# Patient Record
Sex: Female | Born: 1975 | Race: White | Hispanic: No | Marital: Married | Smoking: Never smoker
Health system: Southern US, Community
[De-identification: ages and names within clinical notes are randomized; demographics above are authoritative.]

## PROBLEM LIST (undated history)

## (undated) ENCOUNTER — Inpatient Hospital Stay (HOSPITAL_COMMUNITY): Payer: Self-pay

## (undated) HISTORY — PX: NO PAST SURGERIES: SHX2092

---

## 1997-10-08 ENCOUNTER — Other Ambulatory Visit: Admission: RE | Admit: 1997-10-08 | Discharge: 1997-10-08 | Payer: Self-pay | Admitting: Obstetrics and Gynecology

## 1998-10-14 ENCOUNTER — Other Ambulatory Visit: Admission: RE | Admit: 1998-10-14 | Discharge: 1998-10-14 | Payer: Self-pay | Admitting: Obstetrics and Gynecology

## 1999-10-22 ENCOUNTER — Other Ambulatory Visit: Admission: RE | Admit: 1999-10-22 | Discharge: 1999-10-22 | Payer: Self-pay | Admitting: Obstetrics and Gynecology

## 2000-11-07 ENCOUNTER — Other Ambulatory Visit: Admission: RE | Admit: 2000-11-07 | Discharge: 2000-11-07 | Payer: Self-pay | Admitting: Obstetrics and Gynecology

## 2001-06-29 ENCOUNTER — Ambulatory Visit (HOSPITAL_COMMUNITY): Admission: RE | Admit: 2001-06-29 | Discharge: 2001-06-29 | Payer: Self-pay | Admitting: Obstetrics and Gynecology

## 2001-06-29 ENCOUNTER — Encounter: Payer: Self-pay | Admitting: Obstetrics and Gynecology

## 2001-08-30 ENCOUNTER — Ambulatory Visit (HOSPITAL_COMMUNITY): Admission: RE | Admit: 2001-08-30 | Discharge: 2001-08-30 | Payer: Self-pay | Admitting: Obstetrics and Gynecology

## 2001-11-11 ENCOUNTER — Inpatient Hospital Stay (HOSPITAL_COMMUNITY): Admission: AD | Admit: 2001-11-11 | Discharge: 2001-11-13 | Payer: Self-pay | Admitting: Obstetrics and Gynecology

## 2001-11-27 ENCOUNTER — Encounter: Admission: RE | Admit: 2001-11-27 | Discharge: 2001-12-27 | Payer: Self-pay | Admitting: Obstetrics and Gynecology

## 2001-12-12 ENCOUNTER — Other Ambulatory Visit: Admission: RE | Admit: 2001-12-12 | Discharge: 2001-12-12 | Payer: Self-pay | Admitting: Obstetrics and Gynecology

## 2003-01-23 ENCOUNTER — Other Ambulatory Visit: Admission: RE | Admit: 2003-01-23 | Discharge: 2003-01-23 | Payer: Self-pay | Admitting: Obstetrics and Gynecology

## 2004-02-12 ENCOUNTER — Other Ambulatory Visit: Admission: RE | Admit: 2004-02-12 | Discharge: 2004-02-12 | Payer: Self-pay | Admitting: Obstetrics and Gynecology

## 2005-02-16 ENCOUNTER — Other Ambulatory Visit: Admission: RE | Admit: 2005-02-16 | Discharge: 2005-02-16 | Payer: Self-pay | Admitting: Obstetrics and Gynecology

## 2005-06-29 ENCOUNTER — Ambulatory Visit (HOSPITAL_COMMUNITY): Admission: RE | Admit: 2005-06-29 | Discharge: 2005-06-29 | Payer: Self-pay | Admitting: Obstetrics and Gynecology

## 2005-08-02 ENCOUNTER — Inpatient Hospital Stay (HOSPITAL_COMMUNITY): Admission: AD | Admit: 2005-08-02 | Discharge: 2005-08-02 | Payer: Self-pay | Admitting: Obstetrics and Gynecology

## 2005-11-09 ENCOUNTER — Ambulatory Visit (HOSPITAL_COMMUNITY): Admission: RE | Admit: 2005-11-09 | Discharge: 2005-11-09 | Payer: Self-pay | Admitting: Obstetrics & Gynecology

## 2006-02-05 ENCOUNTER — Inpatient Hospital Stay (HOSPITAL_COMMUNITY): Admission: AD | Admit: 2006-02-05 | Discharge: 2006-02-07 | Payer: Self-pay | Admitting: Obstetrics and Gynecology

## 2008-05-15 HISTORY — PX: AUGMENTATION MAMMAPLASTY: SUR837

## 2015-03-14 ENCOUNTER — Encounter (HOSPITAL_COMMUNITY): Payer: Self-pay | Admitting: *Deleted

## 2015-03-14 ENCOUNTER — Inpatient Hospital Stay (HOSPITAL_COMMUNITY)
Admission: AD | Admit: 2015-03-14 | Discharge: 2015-03-14 | Disposition: A | Discharge status: Home / Self Care | Payer: BC Managed Care – PPO | Source: Ambulatory Visit | Attending: Obstetrics and Gynecology | Admitting: Obstetrics and Gynecology

## 2015-03-14 DIAGNOSIS — N930 Postcoital and contact bleeding: Secondary | ICD-10-CM

## 2015-03-14 DIAGNOSIS — O4692 Antepartum hemorrhage, unspecified, second trimester: Secondary | ICD-10-CM

## 2015-03-14 DIAGNOSIS — Z3A13 13 weeks gestation of pregnancy: Secondary | ICD-10-CM | POA: Diagnosis not present

## 2015-03-14 DIAGNOSIS — O209 Hemorrhage in early pregnancy, unspecified: Secondary | ICD-10-CM | POA: Diagnosis present

## 2015-03-14 DIAGNOSIS — O26891 Other specified pregnancy related conditions, first trimester: Secondary | ICD-10-CM | POA: Insufficient documentation

## 2015-03-14 DIAGNOSIS — Z6791 Unspecified blood type, Rh negative: Secondary | ICD-10-CM | POA: Diagnosis not present

## 2015-03-14 DIAGNOSIS — O360121 Maternal care for anti-D [Rh] antibodies, second trimester, fetus 1: Secondary | ICD-10-CM

## 2015-03-14 LAB — URINALYSIS, ROUTINE W REFLEX MICROSCOPIC
Bilirubin Urine: NEGATIVE
Glucose, UA: NEGATIVE mg/dL
Ketones, ur: NEGATIVE mg/dL
Leukocytes, UA: NEGATIVE
Nitrite: NEGATIVE
Protein, ur: NEGATIVE mg/dL
Specific Gravity, Urine: 1.01 (ref 1.005–1.030)
Urobilinogen, UA: 0.2 mg/dL (ref 0.0–1.0)
pH: 6 (ref 5.0–8.0)

## 2015-03-14 LAB — WET PREP, GENITAL
CLUE CELLS WET PREP: NONE SEEN
TRICH WET PREP: NONE SEEN
YEAST WET PREP: NONE SEEN

## 2015-03-14 LAB — URINE MICROSCOPIC-ADD ON

## 2015-03-14 MED ORDER — RHO D IMMUNE GLOBULIN 1500 UNIT/2ML IJ SOSY
300.0000 ug | PREFILLED_SYRINGE | Freq: Once | INTRAMUSCULAR | Status: AC
  Administered 2015-03-14: 300 ug via INTRAMUSCULAR
  Filled 2015-03-14: qty 2

## 2015-03-14 NOTE — MAU Provider Note (Signed)
Chief Complaint: Vaginal Bleeding   First Provider Initiated Contact with Patient 03/14/15 1030      SUBJECTIVE HPI: Carol Vaughan is a 39 y.o. G4P3003 at [redacted]w[redacted]d by LMP who presents to maternity admissions reporting onset of bright red bleeding early this morning.  She reports she went to the bathroom during the night and noticed light red bleeding, then the next time she went to the bathroom saw a small clot in the toilet and more bleeding when wiping. She was bleeding red to brownish by the time she left home and upon arriving in MAU reports her pad has very little blood on it.  She reports intercourse last night.   She denies abdominal pain, vaginal itching/burning, urinary symptoms, h/a, dizziness, n/v, or fever/chills.     Vaginal Bleeding The patient's primary symptoms include vaginal bleeding. The patient's pertinent negatives include no pelvic pain or vaginal discharge. This is a new problem. The current episode started today. The problem occurs intermittently. The problem has been gradually improving. The patient is experiencing no pain. She is pregnant. Pertinent negatives include no abdominal pain, chills, constipation, diarrhea, dysuria, fever, flank pain, frequency, headaches, nausea, urgency or vomiting. The vaginal bleeding is lighter than menses. She has been passing clots. She has not been passing tissue. The symptoms are aggravated by intercourse. She has tried nothing for the symptoms. She is sexually active. No, her partner does not have an STD.    History reviewed. No pertinent past medical history. Past Surgical History  Procedure Laterality Date  . No past surgeries     Social History   Social History  . Marital Status: Married    Spouse Name: N/A  . Number of Children: N/A  . Years of Education: N/A   Occupational History  . Not on file.   Social History Main Topics  . Smoking status: Never Smoker   . Smokeless tobacco: Not on file  . Alcohol Use: No  . Drug  Use: No  . Sexual Activity: Not on file   Other Topics Concern  . Not on file   Social History Narrative  . No narrative on file   No current facility-administered medications on file prior to encounter.   No current outpatient prescriptions on file prior to encounter.   No Known Allergies  ROS:  Review of Systems  Constitutional: Negative for fever, chills and fatigue.  HENT: Negative for sinus pressure.   Eyes: Negative for photophobia.  Respiratory: Negative for shortness of breath.   Cardiovascular: Negative for chest pain.  Gastrointestinal: Negative for nausea, vomiting, abdominal pain, diarrhea and constipation.  Genitourinary: Positive for vaginal bleeding. Negative for dysuria, urgency, frequency, flank pain, vaginal discharge, difficulty urinating, vaginal pain and pelvic pain.  Musculoskeletal: Negative for neck pain.  Neurological: Negative for dizziness, weakness and headaches.  Psychiatric/Behavioral: Negative.      I have reviewed patient's Past Medical Hx, Surgical Hx, Family Hx, Social Hx, medications and allergies.   Physical Exam   Patient Vitals for the past 24 hrs:  BP Temp Temp src Pulse Resp SpO2 Height Weight  03/14/15 1309 108/66 mmHg - - 73 16 - - -  03/14/15 0927 125/73 mmHg 98.1 F (36.7 C) Oral 105 18 99 %  (1.651 m) 57.607 kg (127 lb)   Constitutional: Well-developed, well-nourished female in no acute distress.  Cardiovascular: normal rate Respiratory: normal effort GI: Abd soft, non-tender. Pos BS x 4 MS: Extremities nontender, no edema, normal ROM Neurologic: Alert and oriented x  4.  GU: Neg CVAT.  PELVIC EXAM: Cervix pink, visually closed, with small area of erythema, slightly friable to cotton swab, small amount light red bleeding, vaginal walls and external genitalia normal  FHT 160 by doppler  LAB RESULTS Results for orders placed or performed during the hospital encounter of 03/14/15 (from the past 24 hour(s))   Urinalysis, Routine w reflex microscopic (not at [facility])     Status: Abnormal   Collection Time: 03/14/15  9:15 AM  Result Value Ref Range   Color, Urine YELLOW YELLOW   APPearance CLEAR CLEAR   Specific Gravity, Urine 1.010 1.005 - 1.030   pH 6.0 5.0 - 8.0   Glucose, UA NEGATIVE NEGATIVE mg/dL   Hgb urine dipstick TRACE (A) NEGATIVE   Bilirubin Urine NEGATIVE NEGATIVE   Ketones, ur NEGATIVE NEGATIVE mg/dL   Protein, ur NEGATIVE NEGATIVE mg/dL   Urobilinogen, UA 0.2 0.0 - 1.0 mg/dL   Nitrite NEGATIVE NEGATIVE   Leukocytes, UA NEGATIVE NEGATIVE  Urine microscopic-add on     Status: Abnormal   Collection Time: 03/14/15  9:15 AM  Result Value Ref Range   Squamous Epithelial / LPF FEW (A) RARE  Wet prep, genital     Status: Abnormal   Collection Time: 03/14/15 10:30 AM  Result Value Ref Range   Yeast Wet Prep HPF POC NONE SEEN NONE SEEN   Trich, Wet Prep NONE SEEN NONE SEEN   Clue Cells Wet Prep HPF POC NONE SEEN NONE SEEN   WBC, Wet Prep HPF POC FEW (A) NONE SEEN  Rh IG workup (includes ABO/Rh)     Status: None (Preliminary result)   Collection Time: 03/14/15 11:33 AM  Result Value Ref Range   Gestational Age(Wks) 13    ABO/RH(D) O NEG    Antibody Screen NEG    Unit Number [id number]    Blood Component Type RHIG    Unit division 00    Status of Unit ISSUED    Transfusion Status OK TO TRANSFUSE     --/--/O NEG (09/16 1133)  IMAGING No results found.  MAU Management/MDM: Ordered labs and reviewed results.  Consult Dr Claiborne Billings, reviewed assessment and labs.  Treatments in MAU included Rhophylac dose for Rh negative status. Pt stable at time of discharge.  ASSESSMENT 1. Postcoital bleeding   2. Vaginal bleeding in pregnancy, second trimester   3. Rh negative state in antepartum period, second trimester, fetus 1     PLAN Discharge home Reassurance provided to pt that bleeding is likely from cervix related to intercourse   Follow-up Information    Follow up with  CALLAHAN, SIDNEY, DO.   Specialty:  Obstetrics and Gynecology   Why:  As scheduled   Contact information:   [address] [place] [address] [phone]       Follow up with [facility].   Why:  As needed for emergencies   Contact information:   [address] [id number] mc [place] [address] [phone]      Sharen Counter Certified Nurse-Midwife 03/14/2015  2:05 PM

## 2015-03-14 NOTE — MAU Note (Addendum)
Patient woke up around 0100 to use the restroom and noticed a quarter sized blood clot in the toilet and some bright red blood when wiping.  No c/o pain or cramping.   Patient had sexual intercourse last night.

## 2015-03-14 NOTE — Discharge Instructions (Signed)
Vaginal Bleeding During Pregnancy, Second Trimester °A small amount of bleeding (spotting) from the vagina is relatively common in pregnancy. It usually stops on its own. Various things can cause bleeding or spotting in pregnancy. Some bleeding may be related to the pregnancy, and some may not. Sometimes the bleeding is normal and is not a problem. However, bleeding can also be a sign of something serious. Be sure to tell your health care provider about any vaginal bleeding right away. °Some possible causes of vaginal bleeding during the second trimester include: °· Infection, inflammation, or growths on the cervix.   °· The placenta may be partially or completely covering the opening of the cervix inside the uterus (placenta previa). °· The placenta may have separated from the uterus (abruption of the placenta).   °· You may be having early (preterm) labor.   °· The cervix may not be strong enough to keep a baby inside the uterus (cervical insufficiency).   °· Tiny cysts may have developed in the uterus instead of pregnancy tissue (molar pregnancy).  °HOME CARE INSTRUCTIONS  °Watch your condition for any changes. The following actions may help to lessen any discomfort you are feeling: °· Follow your health care provider's instructions for limiting your activity. If your health care provider orders bed rest, you may need to stay in bed and only get up to use the bathroom. However, your health care provider may allow you to continue light activity. °· If needed, make plans for someone to help with your regular activities and responsibilities while you are on bed rest. °· Keep track of the number of pads you use each day, how often you change pads, and how soaked (saturated) they are. Write this down. °· Do not use tampons. Do not douche. °· Do not have sexual intercourse or orgasms until approved by your health care provider. °· If you pass any tissue from your vagina, save the tissue so you can show it to your  health care provider. °· Only take over-the-counter or prescription medicines as directed by your health care provider. °· Do not take aspirin because it can make you bleed. °· Do not exercise or perform any strenuous activities or heavy lifting without your health care provider's permission. °· Keep all follow-up appointments as directed by your health care provider. °SEEK MEDICAL CARE IF: °· You have any vaginal bleeding during any part of your pregnancy. °· You have cramps or labor pains. °· You have a fever, not controlled by medicine. °SEEK IMMEDIATE MEDICAL CARE IF:  °· You have severe cramps in your back or belly (abdomen). °· You have contractions. °· You have chills. °· You pass large clots or tissue from your vagina. °· Your bleeding increases. °· You feel light-headed or weak, or you have fainting episodes. °· You are leaking fluid or have a gush of fluid from your vagina. °MAKE SURE YOU: °· Understand these instructions. °· Will watch your condition. °· Will get help right away if you are not doing well or get worse. °Document Released: 03/24/2005 Document Revised: 06/19/2013 Document Reviewed: 02/19/2013 °ExitCare® Patient Information ©2015 [employer]. This information is not intended to replace advice given to you by your health care provider. Make sure you discuss any questions you have with your health care provider. ° °

## 2015-03-15 LAB — RH IG WORKUP (INCLUDES ABO/RH)
ABO/RH(D): O NEG
ANTIBODY SCREEN: NEGATIVE
Gestational Age(Wks): 13
UNIT DIVISION: 0

## 2015-06-29 NOTE — L&D Delivery Note (Signed)
Pt presented to the ER in active labor. Shortly after arrival she had a SVD of one live female infant over a first degree midline tear in the vtx presentation. Placenta -S/I. EBL-400cc. Baby to NBN. Tear closed with 3-0 chromic.

## 2015-09-01 ENCOUNTER — Inpatient Hospital Stay (HOSPITAL_COMMUNITY)
Admission: AD | Admit: 2015-09-01 | Discharge: 2015-09-03 | DRG: 775 | Disposition: A | Discharge status: Home / Self Care | Payer: BC Managed Care – PPO | Source: Ambulatory Visit | Attending: Obstetrics and Gynecology | Admitting: Obstetrics and Gynecology

## 2015-09-01 DIAGNOSIS — Z3490 Encounter for supervision of normal pregnancy, unspecified, unspecified trimester: Secondary | ICD-10-CM

## 2015-09-01 DIAGNOSIS — Z3A37 37 weeks gestation of pregnancy: Secondary | ICD-10-CM

## 2015-09-01 MED ORDER — LIDOCAINE HCL (PF) 1 % IJ SOLN
INTRAMUSCULAR | Status: AC
  Administered 2015-09-01: 30 mL
  Filled 2015-09-01: qty 30

## 2015-09-01 MED ORDER — OXYTOCIN 10 UNIT/ML IJ SOLN
INTRAMUSCULAR | Status: AC
Start: 2015-09-01 — End: 2015-09-01
  Administered 2015-09-01: 10 [IU]
  Filled 2015-09-01: qty 1

## 2015-09-01 NOTE — H&P (Addendum)
Pt is a 40 y/o white female, G4P3003 at term who was admitted to the ER in labor. On admission she was in active labor and she delivered in the ER. PNC was complicated by AMA. NIPT-wnl GBS-neg PMHX: See hollister PE: VSSAF        HEENT-wnl        ABD-gravid IMP/ IUP at term in labor        AMA Plan/ Admit

## 2015-09-01 NOTE — MAU Note (Signed)
Pt presents with urge to push, complete/+2 on admission, H.Hogan,CNM in and Dr Dareen PianoAnderson notified to come for delivery.

## 2015-09-02 ENCOUNTER — Encounter (HOSPITAL_COMMUNITY): Payer: Self-pay

## 2015-09-02 DIAGNOSIS — Z3490 Encounter for supervision of normal pregnancy, unspecified, unspecified trimester: Secondary | ICD-10-CM

## 2015-09-02 LAB — CBC
HCT: 34.6 % — ABNORMAL LOW (ref 36.0–46.0)
Hemoglobin: 11.7 g/dL — ABNORMAL LOW (ref 12.0–15.0)
MCH: 31.3 pg (ref 26.0–34.0)
MCHC: 33.8 g/dL (ref 30.0–36.0)
MCV: 92.5 fL (ref 78.0–100.0)
PLATELETS: 219 10*3/uL (ref 150–400)
RBC: 3.74 MIL/uL — ABNORMAL LOW (ref 3.87–5.11)
RDW: 14 % (ref 11.5–15.5)
WBC: 15.3 10*3/uL — ABNORMAL HIGH (ref 4.0–10.5)

## 2015-09-02 LAB — RPR: RPR Ser Ql: NONREACTIVE

## 2015-09-02 MED ORDER — ZOLPIDEM TARTRATE 5 MG PO TABS: 5.0000 mg | ORAL_TABLET | Freq: Every evening | ORAL | Status: DC | PRN

## 2015-09-02 MED ORDER — WITCH HAZEL-GLYCERIN EX PADS
1.0000 "application " | MEDICATED_PAD | CUTANEOUS | Status: DC | PRN
  Administered 2015-09-02: 1 via TOPICAL

## 2015-09-02 MED ORDER — ONDANSETRON HCL 4 MG PO TABS: 4.0000 mg | ORAL_TABLET | ORAL | Status: DC | PRN

## 2015-09-02 MED ORDER — TETANUS-DIPHTH-ACELL PERTUSSIS 5-2.5-18.5 LF-MCG/0.5 IM SUSP: 0.5000 mL | Freq: Once | INTRAMUSCULAR | Status: DC

## 2015-09-02 MED ORDER — DIBUCAINE 1 % RE OINT: 1.0000 "application " | TOPICAL_OINTMENT | RECTAL | Status: DC | PRN

## 2015-09-02 MED ORDER — ONDANSETRON HCL 4 MG/2ML IJ SOLN: 4.0000 mg | INTRAMUSCULAR | Status: DC | PRN

## 2015-09-02 MED ORDER — SIMETHICONE 80 MG PO CHEW: 80.0000 mg | CHEWABLE_TABLET | ORAL | Status: DC | PRN

## 2015-09-02 MED ORDER — BENZOCAINE-MENTHOL 20-0.5 % EX AERO
1.0000 "application " | INHALATION_SPRAY | CUTANEOUS | Status: DC | PRN
  Administered 2015-09-02: 1 via TOPICAL
  Filled 2015-09-02: qty 56

## 2015-09-02 MED ORDER — ACETAMINOPHEN 325 MG PO TABS: 650.0000 mg | ORAL_TABLET | ORAL | Status: DC | PRN

## 2015-09-02 MED ORDER — IBUPROFEN 600 MG PO TABS
600.0000 mg | ORAL_TABLET | Freq: Four times a day (QID) | ORAL | Status: DC
  Administered 2015-09-02 – 2015-09-03 (×6): 600 mg via ORAL
  Filled 2015-09-02 (×7): qty 1

## 2015-09-02 MED ORDER — SENNOSIDES-DOCUSATE SODIUM 8.6-50 MG PO TABS
2.0000 | ORAL_TABLET | ORAL | Status: DC
  Administered 2015-09-03: 2 via ORAL
  Filled 2015-09-02: qty 2

## 2015-09-02 MED ORDER — MEASLES, MUMPS & RUBELLA VAC ~~LOC~~ INJ
0.5000 mL | INJECTION | Freq: Once | SUBCUTANEOUS | Status: AC
  Administered 2015-09-03: 0.5 mL via SUBCUTANEOUS
  Filled 2015-09-02 (×2): qty 0.5

## 2015-09-02 NOTE — Lactation Note (Signed)
This note was copied from a baby's chart. Lactation Consultation Note; Mom reports that she tried breastfeeding with her other babies but her nipples got very sore and she gave up after a few [name]. Reviewed correct latch- wide open mouth and keeping the baby close to the breast throughout the feeding. Attempted to latch baby but she was too sleepy and only took a few sucks then off to sleep. Left skin to skin with mom. BF brochure given with resources for support after DC. Reviewed BFSG and OP appointments after DCTo call for assist prn  Patient Name: Carol Vaughan Shealy WUJWJ'XToday's Date: 09/02/2015 Reason for consult: Initial assessment   Maternal Data Formula Feeding for Exclusion: No Has patient been taught Hand Expression?: Yes Does the patient have breastfeeding experience prior to this delivery?: Yes  Feeding Feeding Type: Breast Fed Length of feed: 40 min (off and on)  LATCH Score/Interventions Latch: Too sleepy or reluctant, no latch achieved, no sucking elicited. (few sucks) Intervention(s): Skin to skin Intervention(s): Adjust position;Assist with latch;Breast compression  Audible Swallowing: None  Type of Nipple: Everted at rest and after stimulation  Comfort (Breast/Nipple): Soft / non-tender     Hold (Positioning): Assistance needed to correctly position infant at breast and maintain latch.  LATCH Score: 5  Lactation Tools Discussed/Used     Consult Status Consult Status: Follow-up Date: 09/03/15 Follow-up type: In-patient    Carol Vaughan, Carol Vaughan 09/02/2015, 1:08 PM

## 2015-09-02 NOTE — Progress Notes (Signed)
Patient is eating, ambulating, voiding.  Pain control is good.  Appropriate lochia, no complaints.  Filed Vitals:   09/02/15 0115 09/02/15 0215 09/02/15 0615 09/02/15 1738  BP: 114/68 110/60 106/72 108/73  Pulse: 74 77 64 69  Temp: 98.7 F (37.1 C) 98.7 F (37.1 C) 98.3 F (36.8 C) 97.6 F (36.4 C)  TempSrc: Oral Oral Oral Oral  Resp: 18 20 16 18   SpO2: 98% 98% 96%     Fundus firm Perineum without swelling. No CT  Lab Results  Component Value Date   WBC 15.3* 09/02/2015   HGB 11.7* 09/02/2015   HCT 34.6* 09/02/2015   MCV 92.5 09/02/2015   PLT 219 09/02/2015    --/--/O NEG (09/16 1133)  A/P Post partum day 1  Routine care.  Expect d/c 3/8    [place], [place]

## 2015-09-02 NOTE — Lactation Note (Signed)
This note was copied from a baby's chart. Lactation Consultation Note  Mom has questions about breastfeeding with implants.  She had the surgery after her last baby and does not know if they are under or over the muscle.  She states she feels like she had adequate breast tissue prior to the surgery and her milk came in without a problem.  Mom wondering if baby needs supplement.  Discussed waiting and watching feeds, weight and output.  Cautioned that she is at higher risk for engorgement.  Mom feels like baby not opening wide.  Reviewed skin to skin and waking techniques.  Encouraged to call with concerns/latch assist prn.  Patient Name: Girl Roselyn Reefmy Korman ZOXWR'UToday's Date: 09/02/2015 Reason for consult: Initial assessment   Maternal Data Formula Feeding for Exclusion: No Has patient been taught Hand Expression?: Yes Does the patient have breastfeeding experience prior to this delivery?: Yes  Feeding Feeding Type: Breast Fed Length of feed: 5 min  LATCH Score/Interventions Latch: Too sleepy or reluctant, no latch achieved, no sucking elicited. (few sucks)  Audible Swallowing: None  Type of Nipple: Everted at rest and after stimulation  Comfort (Breast/Nipple): Soft / non-tender     Hold (Positioning): Assistance needed to correctly position infant at breast and maintain latch.  LATCH Score: 5  Lactation Tools Discussed/Used     Consult Status Consult Status: Follow-up Date: 09/03/15 Follow-up type: In-patient    Huston FoleyMOULDEN, Anaira Seay S 09/02/2015, 4:53 PM

## 2015-09-03 MED ORDER — DOCUSATE SODIUM 100 MG PO CAPS: 100.0000 mg | ORAL_CAPSULE | Freq: Two times a day (BID) | ORAL | Status: DC

## 2015-09-03 MED ORDER — RHO D IMMUNE GLOBULIN 1500 UNIT/2ML IJ SOSY
300.0000 ug | PREFILLED_SYRINGE | Freq: Once | INTRAMUSCULAR | Status: AC
  Administered 2015-09-03: 300 ug via INTRAMUSCULAR
  Filled 2015-09-03: qty 2

## 2015-09-03 MED ORDER — IBUPROFEN 600 MG PO TABS: 600.0000 mg | ORAL_TABLET | Freq: Four times a day (QID) | ORAL | Status: DC | PRN

## 2015-09-03 MED ORDER — OXYCODONE-ACETAMINOPHEN 5-325 MG PO TABS: 2.0000 | ORAL_TABLET | ORAL | Status: DC | PRN

## 2015-09-03 NOTE — Lactation Note (Addendum)
This note was copied from a baby's chart. Lactation Consultation Note  Patient Name: Carol Carol Vaughan: 09/03/2015 Reason for consult: Follow-up assessment   Follow up with mom and 36 hour old infant. Infant with 7 BF for 10-45 minutes, 3 attempts, 3 voids and 5 stools in last 24 hours. Infant weight 6 lb 5.4 oz with 4 % weight loss since birth. LATCH Scores 5-6 by bedside RN's. Enc mom to feed infant 8-12 x in 24 hours at first feeding cues.   Mom with small compressible breasts and everted nipples.  Mom report nipple pain with latch that improves with feeding. She reports they are redder today and small scab noted to left nipple in center. Enc mom to express colostrum and apply to nipples post BF.   Infant awake and alert. Mom latching infant in cradle hold to left breast, infant rooting and trying to latch.  Discussed using cross cradle or football hold for feeding in early NB period. Infant noted to have flanged lips and intermittent swallows after deeper latch obtained. Infant did need stimulation to maintain suckling.  Mom with a history of breast augmentation before this baby. Discussed that if ducts were damaged or manipulated during surgery, she may or may not experience diminished milk supply and/or unresolved engorgement in areas of the breast affected. Implants may also impede milk flow and therefore exacerbate engorgement. Advised her that infant needs to be followed closely for I/O and weight. [place] Advised her that if infant is over fussy or overly sleepy, breasts not softening with feeding, and I/O does not improve or decreases to call ped. right away. Referred mom to [email or web address] "Breast Feeding after Breast and Nipple Surgery". Advised mom that OP appt is recommended, mom did not wish to set up OP appt today. She has LC Brochure, aware of OP Services, LC phone # and BF Support Groups.   Manual pump given to mom with instructions for  use and cleaning. Mom is calling to order DEBP from insurance company.  Reviewed all BF information in Taking Care of Baby and Me Booklet. Reviewed engorgement treatment/prevention, prepumping to soften areola and comfort pumping. Reviewed I/O and importance of keeping feeding log and taking to ped visit.    Maternal Data Formula Feeding for Exclusion: No Has patient been taught Hand Expression?: Yes Does the patient have breastfeeding experience prior to this delivery?: Yes  Feeding Feeding Type: Breast Fed Length of feed: 15 min  LATCH Score/Interventions Latch: Repeated attempts needed to sustain latch, nipple held in mouth throughout feeding, stimulation needed to elicit sucking reflex. Intervention(s): Skin to skin;Teach feeding cues;Waking techniques Intervention(s): Adjust position;Assist with latch;Breast massage;Breast compression  Audible Swallowing: A few with stimulation Intervention(s): Hand expression Intervention(s): Skin to skin;Hand expression;Alternate breast massage  Type of Nipple: Everted at rest and after stimulation  Comfort (Breast/Nipple): Filling, red/small blisters or bruises, mild/mod discomfort  Problem noted: Cracked, bleeding, blisters, bruises (Pain mainly with latch, small scab to left nipple) Interventions  (Cracked/bleeding/bruising/blister): Expressed breast milk to nipple  Hold (Positioning): Assistance needed to correctly position infant at breast and maintain latch. Intervention(s): Breastfeeding basics reviewed;Support Pillows;Position options;Skin to skin  LATCH Score: 6  Lactation Tools Discussed/Used WIC Program: No Pump Review: Milk Storage;Setup, frequency, and cleaning Initiated by:: Noralee StainSharon Iliana Hutt, RN, IBCLC   Consult Status Consult Status: Complete Follow-up type: Call as needed    Ed BlalockSharon S Nivea Wojdyla 09/03/2015, 11:19 AM

## 2015-09-03 NOTE — Discharge Summary (Signed)
Obstetric Discharge Summary Reason for Admission: onset of labor Prenatal Procedures: ultrasound Intrapartum Procedures: spontaneous vaginal delivery Postpartum Procedures: Rho(D) Ig Complications-Operative and Postpartum: 1st degree perineal laceration HEMOGLOBIN  Date Value Ref Range Status  09/02/2015 11.7* 12.0 - 15.0 g/dL Final   HCT  Date Value Ref Range Status  09/02/2015 34.6* 36.0 - 46.0 % Final    Physical Exam:  General: alert, cooperative and appears stated age 32Lochia: appropriate Uterine Fundus: firm Incision: healing well DVT Evaluation: No evidence of DVT seen on physical exam.  Discharge Diagnoses: Term Pregnancy-delivered  Discharge Information: Date: 09/03/2015 Activity: pelvic rest Diet: routine Medications: Ibuprofen, Colace and Percocet Condition: improved Instructions: refer to practice specific booklet Discharge to: home Follow-up Information    Follow up with Almon HerculesOSS,Sarina Robleto H., MD.   Specialty:  Obstetrics and Gynecology   Contact information:   [address] [place] [id number] [phone]       Follow up with Levi AlandANDERSON,MARK E, MD In 4 weeks.   Specialty:  Obstetrics and Gynecology   Why:  for a postpartum evaluation   Contact information:   [address] [place] [phone]       Newborn Data: Live born female  Birth Weight: 6 lb 10 oz (3005 g) APGAR: 8, 9  Home with mother.  Carol Aldaco H. 09/03/2015, 8:25 AM

## 2015-09-04 LAB — RH IG WORKUP (INCLUDES ABO/RH)
ABO/RH(D): O NEG
ANTIBODY SCREEN: POSITIVE
DAT, IgG: NEGATIVE
Fetal Screen: NEGATIVE
Gestational Age(Wks): 37.5
UNIT DIVISION: 0

## 2017-12-21 ENCOUNTER — Ambulatory Visit (INDEPENDENT_AMBULATORY_CARE_PROVIDER_SITE_OTHER): Payer: BC Managed Care – PPO

## 2017-12-21 ENCOUNTER — Ambulatory Visit: Payer: BC Managed Care – PPO | Admitting: Sports Medicine

## 2017-12-21 ENCOUNTER — Other Ambulatory Visit: Payer: Self-pay | Admitting: Sports Medicine

## 2017-12-21 ENCOUNTER — Encounter: Payer: Self-pay | Admitting: Sports Medicine

## 2017-12-21 VITALS — BP 110/66 | HR 58 | Temp 98.6°F | Resp 16 | Ht 65.0 in | Wt 125.0 lb

## 2017-12-21 DIAGNOSIS — M79674 Pain in right toe(s): Secondary | ICD-10-CM

## 2017-12-21 DIAGNOSIS — L603 Nail dystrophy: Secondary | ICD-10-CM | POA: Diagnosis not present

## 2017-12-21 DIAGNOSIS — M79675 Pain in left toe(s): Secondary | ICD-10-CM | POA: Diagnosis not present

## 2017-12-21 DIAGNOSIS — M2010 Hallux valgus (acquired), unspecified foot: Secondary | ICD-10-CM

## 2017-12-21 DIAGNOSIS — M2011 Hallux valgus (acquired), right foot: Secondary | ICD-10-CM

## 2017-12-21 DIAGNOSIS — M2012 Hallux valgus (acquired), left foot: Secondary | ICD-10-CM | POA: Diagnosis not present

## 2017-12-21 NOTE — Patient Instructions (Signed)

## 2017-12-21 NOTE — Progress Notes (Signed)
Subjective: Carol Vaughan is a 42 y.o. female patient seen today in office with complaint of mildly painful thickened and discolored left 1st toenail. Patient is desiring treatment for nail changes; has tried OTC topicals/ Lamisil medication in the past with no improvement. Reports that nails are becoming difficult to manage because of the thickness.  Patient admits to a previous history of injury to the toenail and losing the toenail in the past.  Patient states that she stops his toe all the time and also feels like she is getting an ingrown toenail at the medial corner.  Patient also admits that the Lamisil that her primary care doctor gave her last year seem like it helped a little but then slowly white spots started to come back.  Does admit to always keeping her nails polished and denies any recent pedicures outside of her home.  Patient does admit to pain over both big toe joints with discomfort and difficulty with fitting shoes.  Patient states that the pain does okay during the summertime however when she does have to wear close in shoes even tennis shoes it can rub the sides of her big toe joints causing her to have pain.  Patient does admit to a family history of bunions.  Patient has no other pedal complaints at this time.   Review of Systems  Musculoskeletal: Positive for back pain and joint pain.  All other systems reviewed and are negative.   Patient Active Problem List   Diagnosis Date Noted  . Pregnancy 09/02/2015    Current Outpatient Medications on File Prior to Visit  Medication Sig Dispense Refill  . citalopram (CELEXA) 20 MG tablet Take 20 mg by mouth daily.     No current facility-administered medications on file prior to visit.     No Known Allergies  Objective: Physical Exam  General: Well developed, nourished, no acute distress, awake, alert and oriented x 3  Vascular: Dorsalis pedis artery 2/4 bilateral, Posterior tibial artery 2/4 bilateral, skin temperature  warm to warm proximal to distal bilateral lower extremities, no varicosities, pedal hair present bilateral.  Neurological: Gross sensation present via light touch bilateral.   Dermatological: Skin is warm, dry, and supple bilateral, left great toenail is tender, short thick, and discolored with mild subungal debris with dried blood at proximal nail fold with mild incurvation of the nail distally resembling ingrowing toenail, no webspace macerations present bilateral, no open lesions present bilateral, no callus/corns/hyperkeratotic tissue present bilateral. No signs of infection bilateral.  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 Muscular strength within normal limits without pain on range of motion. No pain with calf compression bilateral.  X-ray left and right foot: Normal osseous mineralization there is increase in intermetatarsal angle supportive of bunion deformity, there is midtarsal breech supportive of pes planus deformity.  Soft tissue margins within normal limits.  No other acute findings.  Assessment and Plan:  Problem List Items Addressed This Visit    None    Visit Diagnoses    Nail dystrophy    -  Primary   Toe pain, left       Hav (hallux abducto valgus), unspecified laterality       Relevant Orders   DG Foot Complete Right   DG Foot Complete Left     -Examined patient -Discussed treatment options for painful dystrophic nails and bunion -Recommend continue with good supportive shoes to prevent irritation to bunions; advised patient if continues to be problematic should consider surgical intervention -  For left great toenail patient does desire to have the nail removed and wants the nail to be sent for fungal culture however patient does have a beach trip coming up and does not want to have any problems with healing after the procedure so would like to postpone a nail procedure until after her beach trip.  Complementary at no charge trim the  left great toenail removing all ingrowing corners using a sterile nail nipper without incident. -Patient to return to office for nail procedure or sooner if symptoms worsen.  Asencion Islamitorya Stylianos Stradling, DPM

## 2017-12-21 NOTE — Progress Notes (Signed)
   Subjective:    Patient ID: Carol Vaughan, female    DOB: 06/29/1975, 42 y.o.   MRN: [id number]  HPI    Review of Systems  Musculoskeletal: Positive for back pain.  All other systems reviewed and are negative.      Objective:   Physical Exam        Assessment & Plan:

## 2018-01-20 ENCOUNTER — Ambulatory Visit: Payer: BC Managed Care – PPO | Admitting: Sports Medicine

## 2018-01-20 ENCOUNTER — Encounter: Payer: Self-pay | Admitting: Sports Medicine

## 2018-01-20 DIAGNOSIS — L603 Nail dystrophy: Secondary | ICD-10-CM

## 2018-01-20 DIAGNOSIS — M79675 Pain in left toe(s): Secondary | ICD-10-CM

## 2018-01-20 DIAGNOSIS — B351 Tinea unguium: Secondary | ICD-10-CM

## 2018-01-20 NOTE — Patient Instructions (Signed)

## 2018-01-20 NOTE — Progress Notes (Signed)
Subjective: Carol Vaughan is a 42 y.o. female patient seen today in office with complaint of for follow-up evaluation of left first toenail patient was seen last visit for this nail problem and returns office today for nail procedure as planned.  Patient denies any acute changes and states that at the direction of her ingrown toenails that seem to help however still is concerned about the discoloration and lifting of the toenail thus desires her nail to be removed.  Patient denies warmth, redness, swelling drainage or any other acute symptoms to the left great toe.  Patient also reports that there is a little soreness at the medial aspect of the right great toe and wants me to look at this toenail as well.  Patient denies any other pedal complaints at this time.  Patient Active Problem List   Diagnosis Date Noted  . Pregnancy 09/02/2015    Current Outpatient Medications on File Prior to Visit  Medication Sig Dispense Refill  . citalopram (CELEXA) 20 MG tablet Take 20 mg by mouth daily.     No current facility-administered medications on file prior to visit.     No Known Allergies  Objective: Physical Exam  General: Well developed, nourished, no acute distress, awake, alert and oriented x 3  Vascular: Dorsalis pedis artery 2/4 bilateral, Posterior tibial artery 2/4 bilateral, skin temperature warm to warm proximal to distal bilateral lower extremities, no varicosities, pedal hair present bilateral.  Neurological: Gross sensation present via light touch bilateral.   Dermatological: Skin is warm, dry, and supple bilateral, left great toenail is tender, short thick, and discolored with mild subungal debris with dried blood at proximal nail fold with mild incurvation of the nail distally resembling ingrowing toenail, there is also mild curvature of the medial aspect of the right great toenail with no surrounding signs of infection and mild dry skin at the nail fold, no webspace macerations  present bilateral, no open lesions present bilateral, no callus/corns/hyperkeratotic tissue present bilateral. No signs of infection bilateral.  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 Muscular strength within normal limits without pain on range of motion. No pain with calf compression bilateral.  Assessment and Plan:  Problem List Items Addressed This Visit    None    Visit Diagnoses    Nail dystrophy    -  Primary   Relevant Orders   Culture, fungus without smear   Onychomycosis       Relevant Orders   Culture, fungus without smear   Nail fungus       Relevant Orders   Culture, fungus without smear     -Examined patient -Discussed treatment options for painful dystrophic nails  -At no charge mechanically trimmed the right great toenail medial nail margin and applied antibiotic cream and Band-Aid advised patient to soak to prevent against infection and to closely monitor if the area becomes re-ingrown would benefit from a nail procedure on this side. -Discussed treatment alternatives and plan of care; Explained left hallux temporary nail avulsion and post procedure course to patient. - After a verbal and written consent, injected 3 ml of a 50:50 mixture of 2% plain  lidocaine and 0.5% plain marcaine in a normal hallux block fashion. Next, a  betadine prep was performed. Anesthesia was tested and found to be appropriate.  The offending left hallux nail and total was then incised from the hyponychium to the epinychium. The nail was removed and cleared from the field and sent to pathology for fungal  culture analysis. The area was curretted for any remaining nail or spicules and then the area was then flushed with alcohol and dressed with antibiotic cream and a dry sterile dressing. -Patient was instructed to leave the dressing intact for today and begin soaking  in a weak solution of Epson salt and water tomorrow. Patient was instructed to  soak for 15  minutes each day and apply neosporin and a gauze or bandaid dressing each day. -Patient was instructed to monitor the toe for signs of infection and return to office if toe becomes red, hot or swollen. Advised patient to ice elevate and take over-the-counter pain medication if needed -Patient to return to office for in 2 weeks for nail check or sooner if symptoms worsen.  Asencion Islamitorya Macklen Wilhoite, DPM

## 2018-02-03 ENCOUNTER — Ambulatory Visit (INDEPENDENT_AMBULATORY_CARE_PROVIDER_SITE_OTHER): Payer: BC Managed Care – PPO | Admitting: Sports Medicine

## 2018-02-03 ENCOUNTER — Encounter: Payer: Self-pay | Admitting: Sports Medicine

## 2018-02-03 ENCOUNTER — Telehealth: Payer: Self-pay | Admitting: Sports Medicine

## 2018-02-03 DIAGNOSIS — Z9889 Other specified postprocedural states: Secondary | ICD-10-CM

## 2018-02-03 DIAGNOSIS — M79675 Pain in left toe(s): Secondary | ICD-10-CM

## 2018-02-03 DIAGNOSIS — L603 Nail dystrophy: Secondary | ICD-10-CM

## 2018-02-03 DIAGNOSIS — B351 Tinea unguium: Secondary | ICD-10-CM

## 2018-02-03 NOTE — Progress Notes (Signed)
Subjective: Carol Vaughan is a 42 y.o. female patient returns to office today for follow up evaluation after having left Hallux total temporary nail avulsion performed on 01/20/2018. Patient has been soaking using epsom salt and applying topical antibiotic covered with bandaid daily. Patient denies fever/chills/nausea/vomitting/any other related constitutional symptoms at this time.  Patient Active Problem List   Diagnosis Date Noted  . Pregnancy 09/02/2015    Current Outpatient Medications on File Prior to Visit  Medication Sig Dispense Refill  . citalopram (CELEXA) 20 MG tablet Take 20 mg by mouth daily.     No current facility-administered medications on file prior to visit.     No Known Allergies  Objective:  General: Well developed, nourished, in no acute distress, alert and oriented x3   Dermatology: Skin is warm, dry and supple bilateral.  Left hallux  nail bed appears to be clean, dry, with mild granular tissue and surrounding eschar/scab with mild maceration present.  (-) Erythema. (-) Edema. (-) serosanguous drainage present. The remaining nails appear unremarkable at this time. There are no other lesions or other signs of infection  present.  Neurovascular status: Intact. No lower extremity swelling; No pain with calf compression bilateral.  Musculoskeletal: Decreased tenderness to palpation of the left hallux. Muscular strength within normal limits bilateral.   Assesement and Plan: Problem List Items Addressed This Visit    None    Visit Diagnoses    S/P nail surgery    -  Primary   Nail dystrophy       Onychomycosis       Nail fungus       Toe pain, left          -Examined patient  -Cleansed left hallux nail bed and gently scrubbed with peroxide and q-tip/curetted away eschar at site and applied antibiotic cream covered with bandaid.  -Discussed plan of care with patient. -Patient to now begin soaking in a weak solution of Epsom salt and warm water once daily.  Patient was instructed to soak for 15-20 minutes each day until the toe appears normal and there is no drainage, redness, tenderness, or swelling at the procedure site, and apply neosporin and a gauze or bandaid dressing each day as needed. May leave open to air at night. -Educated patient on long term care after nail surgery. -Patient was instructed to monitor the toe for reoccurrence and signs of infection; Patient advised to return to office or go to ER if toe becomes red, hot or swollen. -Patient is to return as needed or sooner if problems arise.  Asencion Islamitorya Jeanette Moffatt, DPM

## 2018-02-03 NOTE — Telephone Encounter (Signed)
I saw Dr. Marylene LandStover about 2 weeks ago and she removed my large great toenail. She was going to send that out for a culture and I was just in the office a few minutes ago for the re-check. I forgot to ask while I was in there what the results of the culture was. I was calling to get those results and see if there was anything further I needed to do treatment wise. If you would call me back at [phone]. Thank you. Bye bye.

## 2018-02-06 NOTE — Telephone Encounter (Signed)
Dr. Stover please advise 

## 2018-02-06 NOTE — Telephone Encounter (Signed)
Her culture results are not in. I will call her with results when available -Dr. Marylene LandStover

## 2018-02-09 ENCOUNTER — Telehealth: Payer: Self-pay | Admitting: Sports Medicine

## 2018-02-09 NOTE — Telephone Encounter (Signed)
Called patient to review results of fungal culture with her over the phone.  Patient has a voicemail box that has not been set up yet.  Will have office nurse to reach out to patient to inform her of her results to see what treatment options she will decide.  Current fungal culture results are positive her treatment options consist of 1.  Oral Lamisil 250 mg for 90 days 2.  Laser treatment 3.  Self trimming/nail debridements and use of topical urea.  -Dr. Marylene LandStover

## 2018-02-09 NOTE — Telephone Encounter (Signed)
Left voicemail for patient to return my call regarding fungal culture results and recommended treatments.

## 2018-02-10 ENCOUNTER — Telehealth: Payer: Self-pay

## 2018-02-10 ENCOUNTER — Telehealth: Payer: Self-pay | Admitting: Sports Medicine

## 2018-02-10 NOTE — Telephone Encounter (Signed)
I got a call from [place], RN yesterday to go over some test results and treatment options. I was returning her call to see if I could find out what those test results were. If you could give me a call back at [phone]. Thank you.

## 2018-02-10 NOTE — Telephone Encounter (Signed)
Spoke with patient discussing treatment options for positive nail fungal culture.  I informed her that she could either take oral, topical, or laser to treat the infection.  She expressed concern about the oral medication and its effects on her liver.  I told her we will get an LFT to make sure that her levels were normal before she started the medication, and then we could recheck her in a month after taking the medication.  I did discuss with her the topical and laser combination as being her best alternative if she did not want to take the oral medication.  She states that she wants to try the oral medication.  I told her I would send over the lab work to [facility] in [place].

## 2018-02-14 ENCOUNTER — Telehealth: Payer: Self-pay | Admitting: *Deleted

## 2018-02-14 ENCOUNTER — Telehealth: Payer: Self-pay | Admitting: Sports Medicine

## 2018-02-14 DIAGNOSIS — Z79899 Other long term (current) drug therapy: Secondary | ICD-10-CM

## 2018-02-14 DIAGNOSIS — B351 Tinea unguium: Secondary | ICD-10-CM

## 2018-02-14 NOTE — Addendum Note (Signed)
Addended by: Renaldo ReelPARRY, Shakema Surita A on: 02/14/2018 02:24 PM   Modules accepted: Orders

## 2018-02-14 NOTE — Telephone Encounter (Signed)
Patient called about the lab work that was suppose to be sent to [facility] in [place]. Please call patient back

## 2018-02-14 NOTE — Telephone Encounter (Signed)
Reorderd labs to [facility]

## 2018-02-14 NOTE — Telephone Encounter (Signed)
Melody, can you please order Hepatic Panel and fax it to [facility], call and inform patient of [facility] location.

## 2018-02-14 NOTE — Telephone Encounter (Signed)
Spoke with patient explained [facility] location and told he I am faxing the request to them now.  Patient stated understanding.

## 2018-02-16 ENCOUNTER — Other Ambulatory Visit: Payer: Self-pay | Admitting: Sports Medicine

## 2018-02-16 DIAGNOSIS — B351 Tinea unguium: Secondary | ICD-10-CM

## 2018-02-16 LAB — HEPATIC FUNCTION PANEL
ALK PHOS: 64 IU/L (ref 39–117)
ALT: 10 IU/L (ref 0–32)
AST: 15 IU/L (ref 0–40)
Albumin: 4.6 g/dL (ref 3.5–5.5)
Bilirubin Total: 0.5 mg/dL (ref 0.0–1.2)
Bilirubin, Direct: 0.12 mg/dL (ref 0.00–0.40)
TOTAL PROTEIN: 7 g/dL (ref 6.0–8.5)

## 2018-02-16 MED ORDER — TERBINAFINE HCL 250 MG PO TABS: 250.0000 mg | ORAL_TABLET | Freq: Every day | ORAL | 0 refills | Status: DC

## 2018-02-16 NOTE — Progress Notes (Signed)
Rx for lamisil sent to [facility] for nail fungus. Patient should take 1 tab daily and should schedule an appt to see us in 6 weeks for med check -Dr. Marylene LandStover

## 2019-03-21 ENCOUNTER — Other Ambulatory Visit: Payer: Self-pay

## 2019-03-21 ENCOUNTER — Encounter: Payer: Self-pay | Admitting: Sports Medicine

## 2019-03-21 ENCOUNTER — Ambulatory Visit: Payer: BC Managed Care – PPO | Admitting: Sports Medicine

## 2019-03-21 DIAGNOSIS — B351 Tinea unguium: Secondary | ICD-10-CM | POA: Diagnosis not present

## 2019-03-21 DIAGNOSIS — M79675 Pain in left toe(s): Secondary | ICD-10-CM | POA: Diagnosis not present

## 2019-03-21 MED ORDER — TERBINAFINE HCL 250 MG PO TABS: ORAL_TABLET | ORAL | 0 refills | Status: DC

## 2019-03-21 NOTE — Progress Notes (Signed)
Subjective: Carol Vaughan is a 43 y.o. female patient seen today in office for follow up evaluation of nail fungus after completing Lamisil. Patient states that he/she is doing well with no adverse reaction.  Patient reports that she honestly did not take all of her medication and then had a little bit more left started resuming taking it and started to see that her nail was clearing back up but did notice that the end of her toenail is loose and lifting and has been using over-the-counter gel and is not sure if it is improving.  Patient has no other pedal complaints at this time.   Patient Active Problem List   Diagnosis Date Noted  . Pregnancy 09/02/2015    Current Outpatient Medications on File Prior to Visit  Medication Sig Dispense Refill  . citalopram (CELEXA) 20 MG tablet Take 20 mg by mouth daily.     No current facility-administered medications on file prior to visit.     No Known Allergies  Objective: Physical Exam  General: Well developed, nourished, no acute distress, awake, alert and oriented x 3  Vascular: Dorsalis pedis artery 2/4 bilateral, Posterior tibial artery 2/4 bilateral, skin temperature warm to warm proximal to distal bilateral lower extremities, no varicosities, pedal hair present bilateral.  Neurological: Gross sensation present via light touch bilateral.   Dermatological: Skin is warm, dry, and supple bilateral, Left great toenail is short thick, and discolored with mild subungal debris and early clearance noted at proximal nail bed but distally nail if lifting, no webspace macerations present bilateral, no open lesions present bilateral, no callus/corns/hyperkeratotic tissue present bilateral. No signs of infection bilateral.  Musculoskeletal: No symptomatic boney deformities noted bilateral. Muscular strength within normal limits without painon range of motion. No pain with calf compression bilateral.  Assessment and Plan:  Problem List Items Addressed  This Visit    None    Visit Diagnoses    Nail fungus    -  Primary   Relevant Medications   terbinafine (LAMISIL) 250 MG tablet   Toe pain, left          -Examined patient -At no additional charge trim the loose portions of the left hallux nail and gently file the nail to healthy appearing nail at the proximal aspect using a sterile nail nipper and dermel -Refill Lamisil to take once weekly per month for the next 3 months -Cont with topical -Advised good hygiene habits and educated patient on proper foot care to prevent re-infection -Patient to return in 12 weeks for follow up evaluation or sooner if symptoms worsen.  Asked patient that if fails to improve can add on laser after next visit.  Landis Martins, DPM

## 2019-07-04 ENCOUNTER — Other Ambulatory Visit: Payer: Self-pay

## 2019-07-04 ENCOUNTER — Encounter: Payer: Self-pay | Admitting: Sports Medicine

## 2019-07-04 ENCOUNTER — Ambulatory Visit: Payer: BC Managed Care – PPO | Admitting: Sports Medicine

## 2019-07-04 DIAGNOSIS — B351 Tinea unguium: Secondary | ICD-10-CM

## 2019-07-04 DIAGNOSIS — M79675 Pain in left toe(s): Secondary | ICD-10-CM | POA: Diagnosis not present

## 2019-07-04 DIAGNOSIS — Z79899 Other long term (current) drug therapy: Secondary | ICD-10-CM | POA: Diagnosis not present

## 2019-07-04 NOTE — Progress Notes (Signed)
Subjective: Carol Vaughan is a 44 y.o. female patient seen today in office for follow up evaluation of nail fungus after completing Lamisil. Finished last month but still at dark spot at medial corner. Patient has no other pedal complaints at this time.   Patient Active Problem List   Diagnosis Date Noted  . Pregnancy 09/02/2015    Current Outpatient Medications on File Prior to Visit  Medication Sig Dispense Refill  . citalopram (CELEXA) 20 MG tablet Take 20 mg by mouth daily.    . fluconazole (DIFLUCAN) 150 MG tablet fluconazole 150 mg tablet    . terbinafine (LAMISIL) 250 MG tablet Take one tab by mouth daily for 1 week out of each month 21 tablet 0  . venlafaxine XR (EFFEXOR-XR) 75 MG 24 hr capsule Take 75 mg by mouth every morning.     No current facility-administered medications on file prior to visit.    No Known Allergies  Objective: Physical Exam  General: Well developed, nourished, no acute distress, awake, alert and oriented x 3  Vascular: Dorsalis pedis artery 2/4 bilateral, Posterior tibial artery 2/4 bilateral, skin temperature warm to warm proximal to distal bilateral lower extremities, no varicosities, pedal hair present bilateral.  Neurological: Gross sensation present via light touch bilateral.   Dermatological: Skin is warm, dry, and supple bilateral, Left great toenail is short thick, and discolored with mild subungal debris and early clearance noted at proximal nail bed but distally nail if lifting, no webspace macerations present bilateral, no open lesions present bilateral, no callus/corns/hyperkeratotic tissue present bilateral. No signs of infection bilateral.  Musculoskeletal: No symptomatic boney deformities noted bilateral. Muscular strength within normal limits without painon range of motion. No pain with calf compression bilateral.  Assessment and Plan:  Problem List Items Addressed This Visit    None    Visit Diagnoses    Nail fungus    -  Primary    Relevant Medications   fluconazole (DIFLUCAN) 150 MG tablet   Toe pain, left       Encounter for long-term current use of medication          -Examined patient -At no additional charge patient to do a round of laser for nail fungus -Cont with topical -Advised good hygiene habits and educated patient on proper foot care to prevent re-infection -Patient to return after laser or sooner if issues arise.  Asencion Islam, DPM

## 2019-07-09 ENCOUNTER — Other Ambulatory Visit: Payer: Self-pay

## 2019-07-09 ENCOUNTER — Ambulatory Visit (INDEPENDENT_AMBULATORY_CARE_PROVIDER_SITE_OTHER): Payer: BC Managed Care – PPO | Admitting: Sports Medicine

## 2019-07-09 DIAGNOSIS — B351 Tinea unguium: Secondary | ICD-10-CM

## 2019-07-09 NOTE — Progress Notes (Signed)
Patient presents today for the 1st laser treatment. Diagnosed with mycotic nail infection by Dr. Marylene Land. Toenail most affected hallux left, medial border.  Patient has already taken lamisil daily x 3 months and also a pulse dosing of lamisil, one pill once a week x 12 weeks.  All other systems are negative.  Nail was filed thin. Laser therapy was administered to 1st toenail left and patient tolerated the treatment well. All safety precautions were in place.    Follow up in 4 weeks for laser # 2.  Treatments (6 total, if necessary) will be at no charge per Dr. Marylene Land.  Picture of nails taken today to document visual progress    Agree with CMA note -Dr. Marylene Land

## 2019-07-09 NOTE — Patient Instructions (Signed)

## 2019-08-06 ENCOUNTER — Other Ambulatory Visit: Payer: Self-pay

## 2019-08-06 ENCOUNTER — Ambulatory Visit (INDEPENDENT_AMBULATORY_CARE_PROVIDER_SITE_OTHER): Payer: BC Managed Care – PPO | Admitting: *Deleted

## 2019-08-06 DIAGNOSIS — B351 Tinea unguium: Secondary | ICD-10-CM

## 2019-08-06 NOTE — Progress Notes (Addendum)
Patient presents today for the 2nd laser treatment. Diagnosed with mycotic nail infection by Dr. Marylene Land.Patient has already taken lamisil daily x 3 months and also a pulse dosing of lamisil, one pill once a week x 12 weeks.Toenail most affected hallux left, medial border.  All other systems are negative.  Nail was filed thin. Laser therapy was administered to 1st toenail left and patient tolerated the treatment well. All safety precautions were in place.    Follow up in 4 weeks for laser # 3.  Treatments (6 total, if necessary) will be at no charge per Dr. Marylene Land.

## 2019-09-03 ENCOUNTER — Ambulatory Visit (INDEPENDENT_AMBULATORY_CARE_PROVIDER_SITE_OTHER): Payer: BC Managed Care – PPO | Admitting: *Deleted

## 2019-09-03 ENCOUNTER — Other Ambulatory Visit: Payer: Self-pay

## 2019-09-03 DIAGNOSIS — B351 Tinea unguium: Secondary | ICD-10-CM

## 2019-09-03 NOTE — Progress Notes (Signed)
Patient presents today for the 3rd laser treatment. Diagnosed with mycotic nail infection by Dr. Marylene Land.Patient has already taken lamisil daily x 3 months and also a pulse dosing of lamisil, one pill once a week x 12 weeks.  Toenail most affected hallux left, medial border. The coloration of the toenail looks pretty good, but she is concerned that there is still some growth deformity in the nail. She did say she injured the toenail years ago on a [employer] shopping cart. I explained that the nail could always grow misshapen as a result of trauma.  All other systems are negative.  Nail was filed thin. Laser therapy was administered to 1st toenail left and patient tolerated the treatment well. All safety precautions were in place.   Follow up in 4 weeks for laser # 4  Treatments (6 total, if necessary) will be at no charge per Dr. Marylene Land.

## 2019-10-01 ENCOUNTER — Ambulatory Visit (INDEPENDENT_AMBULATORY_CARE_PROVIDER_SITE_OTHER): Payer: BC Managed Care – PPO | Admitting: *Deleted

## 2019-10-01 ENCOUNTER — Other Ambulatory Visit: Payer: Self-pay

## 2019-10-01 DIAGNOSIS — B351 Tinea unguium: Secondary | ICD-10-CM

## 2019-10-01 NOTE — Progress Notes (Signed)
Patient presents today for the 4th laser treatment. Diagnosed with mycotic nail infection by Dr. Marylene Land.Patient has already taken lamisil daily x 3 months and also a pulse dosing of lamisil, one pill once a week x 12 weeks.  Toenail most affected hallux left, medial border. The nail is still has a little discoloration, but her concern is that it hasn't grown.  All other systems are negative.  Nail was filed thin. Laser therapy was administered to 1st toenail left and patient tolerated the treatment well. All safety precautions were in place.   Follow up in 4 weeks for laser # 5.  Treatments (6 total, if necessary) will be at no charge per Dr. Marylene Land.  She is concerned that there is still some growth deformity in the nail. She did say she injured the toenail years ago on a [employer] shopping cart. I explained that the nail could always grow misshapen as a result of trauma.

## 2019-10-29 ENCOUNTER — Other Ambulatory Visit: Payer: BC Managed Care – PPO

## 2019-11-02 ENCOUNTER — Other Ambulatory Visit: Payer: Self-pay

## 2019-11-02 ENCOUNTER — Ambulatory Visit (INDEPENDENT_AMBULATORY_CARE_PROVIDER_SITE_OTHER): Payer: BC Managed Care – PPO | Admitting: *Deleted

## 2019-11-02 DIAGNOSIS — B351 Tinea unguium: Secondary | ICD-10-CM

## 2019-11-02 NOTE — Progress Notes (Signed)
Patient presents today for the 5th laser treatment. Diagnosed with mycotic nail infection by Dr. Marylene Land.  Patient has already taken lamisil daily x 3 months and also a pulse dosing of lamisil, one pill once a week x 12 weeks.  Toenail most affected hallux left, medial border. Today the nail has a discolored crack through the middle. She is very frustrated that it doesn't seem to be getting any better.  All other systems are negative.  Nail was filed thin. Laser therapy was administered to 1st toenail left and patient tolerated the treatment well. All safety precautions were in place.   Follow up in 4 weeks for laser # 6.  Treatments (6 total, if necessary) will be at no charge per Dr. Marylene Land.  She is concerned that there is still some growth deformity in the nail. She did say she injured the toenail years ago on a [employer] shopping cart. I explained that the nail could always grow misshapen as a result of trauma.  ~Take final pic next visit~

## 2019-11-30 ENCOUNTER — Other Ambulatory Visit: Payer: BC Managed Care – PPO

## 2019-12-24 ENCOUNTER — Other Ambulatory Visit: Payer: Self-pay

## 2019-12-24 ENCOUNTER — Ambulatory Visit (INDEPENDENT_AMBULATORY_CARE_PROVIDER_SITE_OTHER): Payer: BC Managed Care – PPO | Admitting: *Deleted

## 2019-12-24 DIAGNOSIS — B351 Tinea unguium: Secondary | ICD-10-CM

## 2019-12-24 NOTE — Progress Notes (Signed)
Patient presents today for the 6th laser treatment. Diagnosed with mycotic nail infection by Dr. Marylene Land.  Patient has already taken lamisil daily x 3 months and also a pulse dosing of lamisil, one pill once a week x 12 weeks.  Toenail most affected hallux left. Today the nail has multiple cracked areas and discolored at the base of the nail. She is very frustrated that laser has not helped.  All other systems are negative.  Nail was filed thin. Laser therapy was administered to 1st toenail left and patient tolerated the treatment well. All safety precautions were in place.   Patient has completed the recommended laser treatments. He will follow up with Dr. Marylene Land in 3 months to evaluate progress.   Treatments (6 total, if necessary) will be at no charge per Dr. Marylene Land.  She is concerned that there is still some growth deformity in the nail. She did say she injured the toenail years ago on a [employer] shopping cart. I explained that the nail could always grow misshapen as a result of trauma.  ~Final pic taken today of nail~

## 2020-03-25 ENCOUNTER — Other Ambulatory Visit: Payer: Self-pay

## 2020-03-25 ENCOUNTER — Encounter: Payer: Self-pay | Admitting: Sports Medicine

## 2020-03-25 ENCOUNTER — Ambulatory Visit: Payer: BC Managed Care – PPO | Admitting: Sports Medicine

## 2020-03-25 DIAGNOSIS — B351 Tinea unguium: Secondary | ICD-10-CM

## 2020-03-25 DIAGNOSIS — M79675 Pain in left toe(s): Secondary | ICD-10-CM | POA: Diagnosis not present

## 2020-03-25 NOTE — Progress Notes (Signed)
Subjective: Carol Vaughan is a 44 y.o. female patient seen today in office for follow up evaluation of nail fungus after completing Laser. Reports that the nail seems not be growing. Patient has no other pedal complaints at this time.   Patient Active Problem List   Diagnosis Date Noted  . Pregnancy 09/02/2015    Current Outpatient Medications on File Prior to Visit  Medication Sig Dispense Refill  . citalopram (CELEXA) 20 MG tablet Take 20 mg by mouth daily.    . meloxicam (MOBIC) 15 MG tablet Take 15 mg by mouth daily.    Marland Kitchen venlafaxine XR (EFFEXOR-XR) 75 MG 24 hr capsule Take 75 mg by mouth every morning.     No current facility-administered medications on file prior to visit.    No Known Allergies  Objective: Physical Exam  General: Well developed, nourished, no acute distress, awake, alert and oriented x 3  Vascular: Dorsalis pedis artery 2/4 bilateral, Posterior tibial artery 2/4 bilateral, skin temperature warm to warm proximal to distal bilateral lower extremities, no varicosities, pedal hair present bilateral.  Neurological: Gross sensation present via light touch bilateral.   Dermatological: Skin is warm, dry, and supple bilateral, Left great toenail is short thick, and discolored with mild subungal debris and early clearance noted at proximal nail bed, no webspace macerations present bilateral, no open lesions present bilateral, no callus/corns/hyperkeratotic tissue present bilateral. No signs of infection bilateral.  Musculoskeletal: No symptomatic boney deformities noted bilateral. Muscular strength within normal limits without painon range of motion. No pain with calf compression bilateral.  Assessment and Plan:  Problem List Items Addressed This Visit    None    Visit Diagnoses    Nail fungus    -  Primary   Toe pain, left          -Examined patient -Nails changes appears to be growing out at the hallux, recommend toecyln nail renewal gel to use as instructed   -Advised good hygiene habits and educated patient on proper foot care to prevent re-infection -Patient to return in 4 months or sooner if issues arise.  Asencion Islam, DPM

## 2020-07-25 ENCOUNTER — Ambulatory Visit (INDEPENDENT_AMBULATORY_CARE_PROVIDER_SITE_OTHER): Payer: BC Managed Care – PPO | Admitting: Sports Medicine

## 2020-07-25 ENCOUNTER — Other Ambulatory Visit: Payer: Self-pay

## 2020-07-25 DIAGNOSIS — B351 Tinea unguium: Secondary | ICD-10-CM

## 2020-07-25 DIAGNOSIS — M79675 Pain in left toe(s): Secondary | ICD-10-CM | POA: Diagnosis not present

## 2020-07-25 NOTE — Progress Notes (Signed)
Subjective: Carol Vaughan is a 45 y.o. female patient seen today in office for follow up evaluation of nail fungus on the left.  Patient reports that instead of finding the Tolcylen she has been using tea tree oil with improvement noted to her nails.  Patient Active Problem List   Diagnosis Date Noted  . Pregnancy 09/02/2015    Current Outpatient Medications on File Prior to Visit  Medication Sig Dispense Refill  . citalopram (CELEXA) 20 MG tablet Take 20 mg by mouth daily.    . meloxicam (MOBIC) 15 MG tablet Take 15 mg by mouth daily.    Marland Kitchen venlafaxine XR (EFFEXOR-XR) 75 MG 24 hr capsule Take 75 mg by mouth every morning.     No current facility-administered medications on file prior to visit.    No Known Allergies  Objective: Physical Exam  General: Well developed, nourished, no acute distress, awake, alert and oriented x 3  Vascular: Dorsalis pedis artery 2/4 bilateral, Posterior tibial artery 2/4 bilateral, skin temperature warm to warm proximal to distal bilateral lower extremities, no varicosities, pedal hair present bilateral.  Neurological: Gross sensation present via light touch bilateral.   Dermatological: Skin is warm, dry, and supple bilateral, Left great toenail is short thick, with discoloration only noted to the end of the nail with most of the nail more than 80% clear and healthy in appearance, no webspace macerations present bilateral, no open lesions present bilateral, no callus/corns/hyperkeratotic tissue present bilateral. No signs of infection bilateral.  Musculoskeletal: No symptomatic boney deformities noted bilateral. Muscular strength within normal limits without painon range of motion. No pain with calf compression bilateral.  Assessment and Plan:  Problem List Items Addressed This Visit   None   Visit Diagnoses    Nail fungus    -  Primary   Toe pain, left           -Examined patient -Nails changes appears to be growing out at the hallux,  recommend continue with tea tree oil -Advised good hygiene habits and educated patient on proper foot care to prevent re-infection -Patient to return if fails to continue to improve or sooner if issues arise.  Asencion Islam, DPM

## 2021-06-03 ENCOUNTER — Other Ambulatory Visit: Payer: Self-pay | Admitting: Obstetrics

## 2021-06-03 DIAGNOSIS — Z1231 Encounter for screening mammogram for malignant neoplasm of breast: Secondary | ICD-10-CM

## 2021-07-07 ENCOUNTER — Ambulatory Visit: Payer: Self-pay

## 2021-07-28 ENCOUNTER — Ambulatory Visit
Admission: RE | Admit: 2021-07-28 | Discharge: 2021-07-28 | Disposition: A | Discharge status: Home / Self Care | Payer: BC Managed Care – PPO | Source: Ambulatory Visit | Attending: Obstetrics | Admitting: Obstetrics

## 2021-07-28 ENCOUNTER — Other Ambulatory Visit: Payer: Self-pay

## 2021-07-28 DIAGNOSIS — Z1231 Encounter for screening mammogram for malignant neoplasm of breast: Secondary | ICD-10-CM

## 2022-03-10 ENCOUNTER — Ambulatory Visit: Payer: BC Managed Care – PPO | Admitting: Podiatry

## 2022-03-10 ENCOUNTER — Encounter: Payer: Self-pay | Admitting: Podiatry

## 2022-03-10 DIAGNOSIS — L603 Nail dystrophy: Secondary | ICD-10-CM

## 2022-03-10 NOTE — Progress Notes (Signed)
  Subjective:  Patient ID: KORYN CHARLOT, female    DOB: [birth year],   MRN: [id number]  Chief Complaint  Patient presents with   Nail Problem    LEFT HALLUX NAIL NOT GROWING BACK CORRECT    46 y.o. female presents for concern of left hallux nail had it removed back in 2019 and has not grown back. Denies any pain. Has been treated for fungus by Dr. Marylene Land in the past as well.  . Denies any other pedal complaints. Denies n/v/f/c.   No past medical history on file.  Objective:  Physical Exam: Vascular: DP/PT pulses 2/4 bilateral. CFT <3 seconds. Normal hair growth on digits. No edema.  Skin. No lacerations or abrasions bilateral feet. Left hallux nail with some white discoloration and loosened from nail bed.  Musculoskeletal: MMT 5/5 bilateral lower extremities in DF, PF, Inversion and Eversion. Deceased ROM in DF of ankle joint.  Neurological: Sensation intact to light touch.   Assessment:  No diagnosis found.   Plan:  Patient was evaluated and treated and all questions answered. -Examined patient -Discussed treatment options for painful dystrophic nails  -Discussed this is likely damage to the nail from previous injury.  -Since not painful would just let grow out. If ever did become painful discussed removal of the nail/  -Patient to return as needed   Louann Sjogren, DPM

## 2022-07-26 ENCOUNTER — Other Ambulatory Visit: Payer: Self-pay | Admitting: Obstetrics

## 2022-07-26 DIAGNOSIS — Z1231 Encounter for screening mammogram for malignant neoplasm of breast: Secondary | ICD-10-CM

## 2022-09-10 ENCOUNTER — Ambulatory Visit
Admission: RE | Admit: 2022-09-10 | Discharge: 2022-09-10 | Disposition: A | Discharge status: Home / Self Care | Payer: BC Managed Care – PPO | Source: Ambulatory Visit | Attending: Obstetrics | Admitting: Obstetrics

## 2022-09-10 DIAGNOSIS — Z1231 Encounter for screening mammogram for malignant neoplasm of breast: Secondary | ICD-10-CM

## 2022-09-15 ENCOUNTER — Other Ambulatory Visit: Payer: Self-pay | Admitting: Obstetrics

## 2022-09-15 DIAGNOSIS — R928 Other abnormal and inconclusive findings on diagnostic imaging of breast: Secondary | ICD-10-CM

## 2022-09-27 ENCOUNTER — Ambulatory Visit: Payer: BC Managed Care – PPO

## 2022-09-27 ENCOUNTER — Ambulatory Visit
Admission: RE | Admit: 2022-09-27 | Discharge: 2022-09-27 | Disposition: A | Discharge status: Home / Self Care | Payer: BC Managed Care – PPO | Source: Ambulatory Visit | Attending: Obstetrics | Admitting: Obstetrics

## 2022-09-27 DIAGNOSIS — R928 Other abnormal and inconclusive findings on diagnostic imaging of breast: Secondary | ICD-10-CM

## 2022-11-17 IMAGING — MG DIGITAL SCREENING BREAST BILAT IMPLANT W/ TOMO W/ CAD
8 of 12 series · 8 of 28 positions shown · non-contrast
Comparison: Previous exam(s).

CLINICAL DATA: Screening.

EXAM:
DIGITAL SCREENING BILATERAL MAMMOGRAM WITH IMPLANTS, CAD AND
TOMOSYNTHESIS
TECHNIQUE: Bilateral screening digital craniocaudal and mediolateral oblique
mammograms were obtained. Bilateral screening digital breast
tomosynthesis was performed. The images were evaluated with
computer-aided detection. Standard and/or implant displaced views
were performed.

[R MLO]
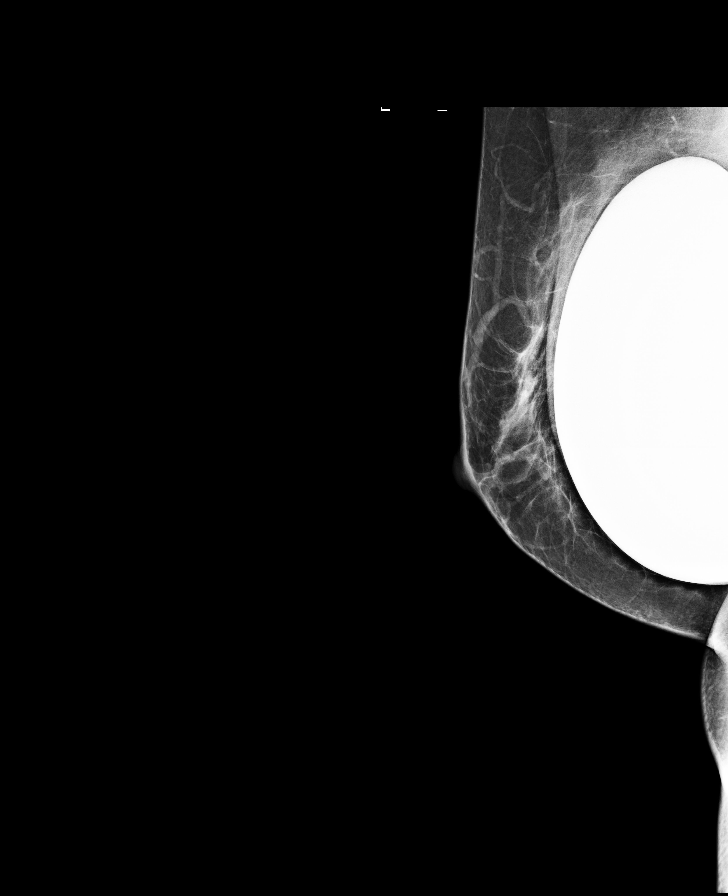

[L CC]
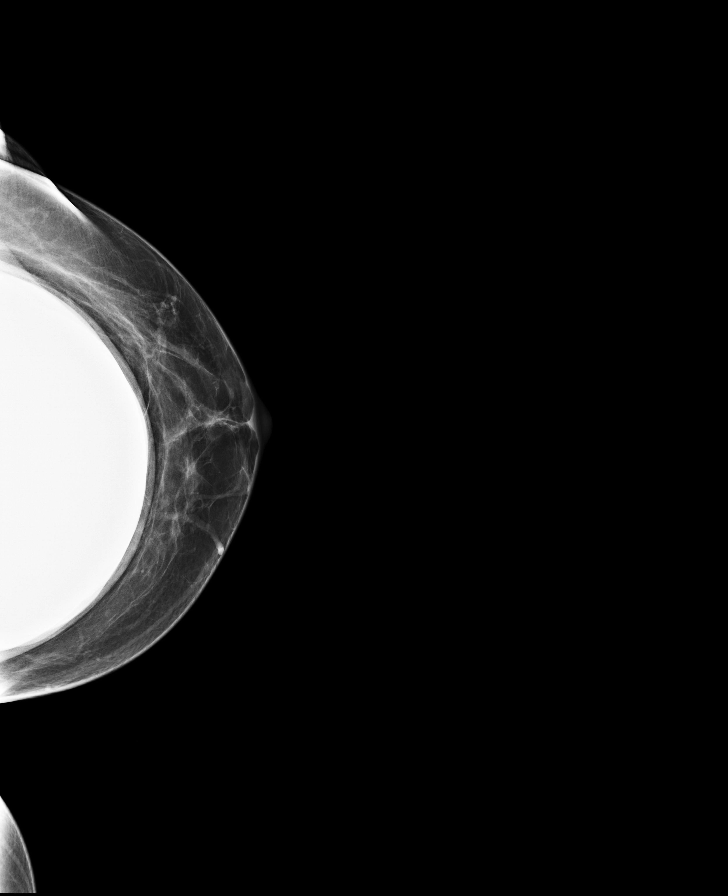

[L MLO]
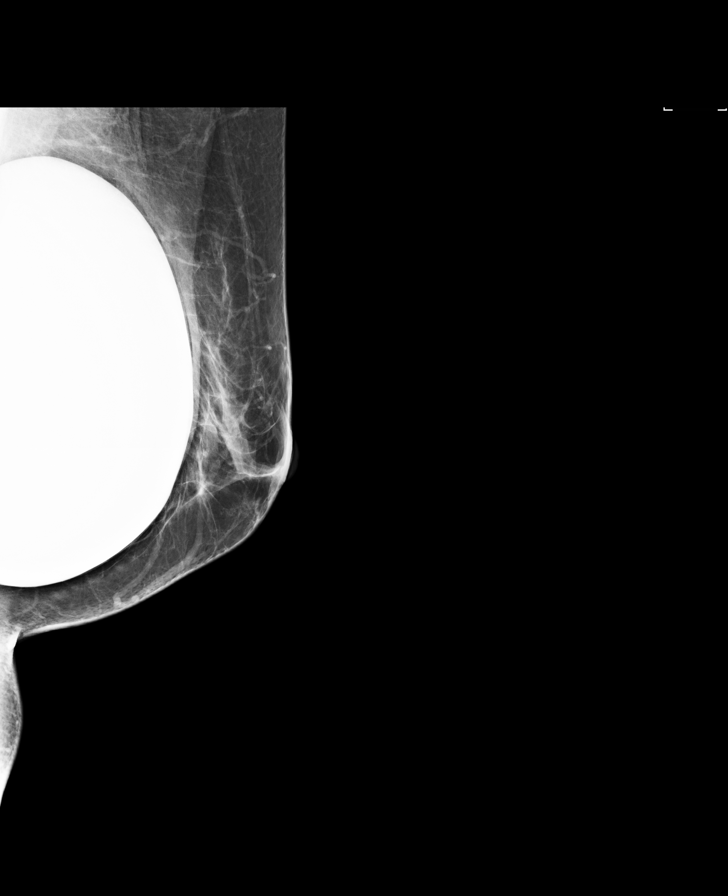

[R CC]
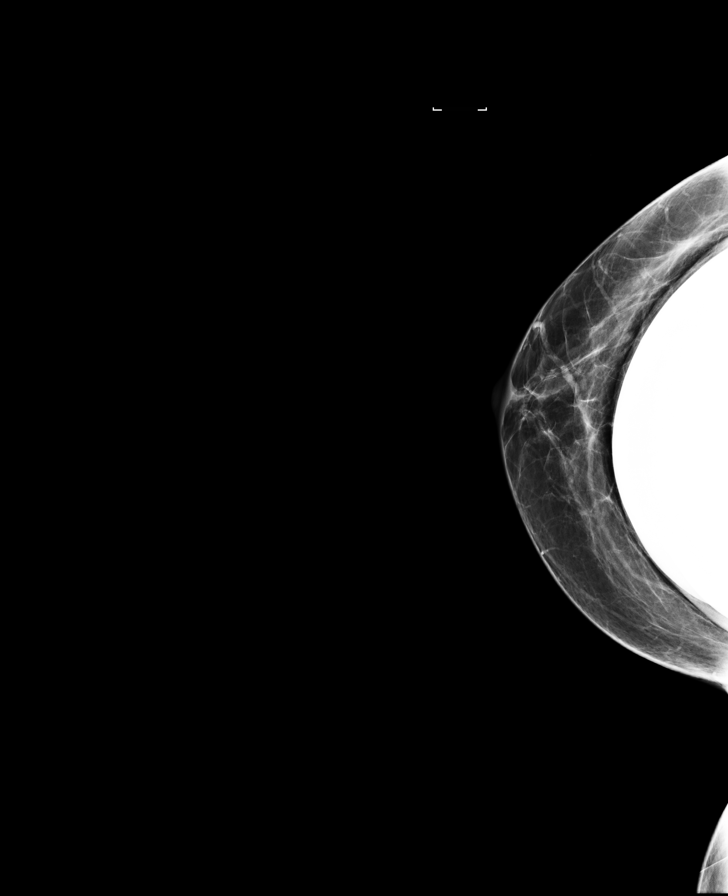

[R CC synth-2D]
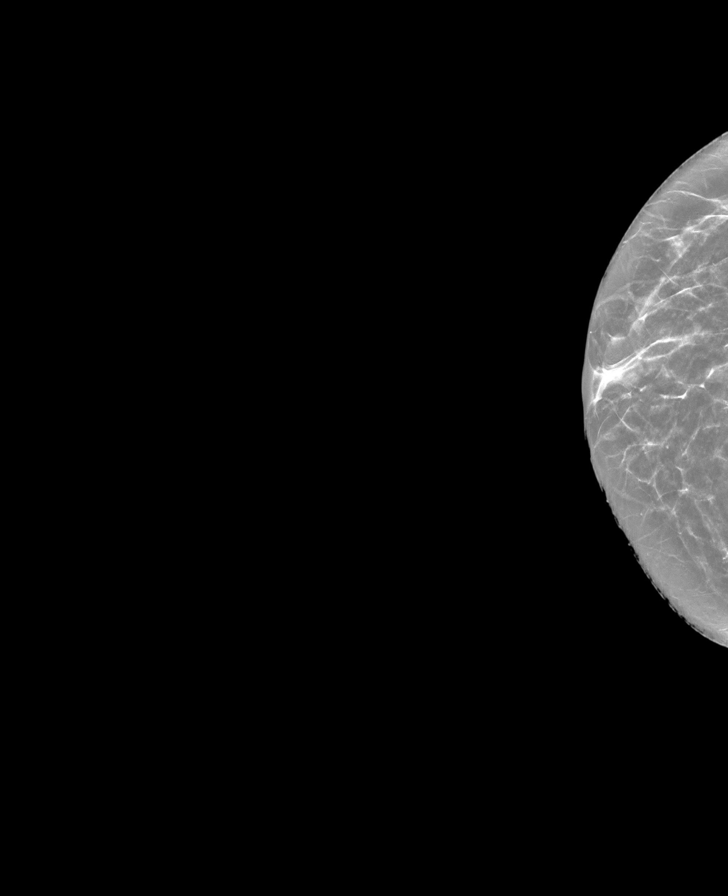

[L CC synth-2D]
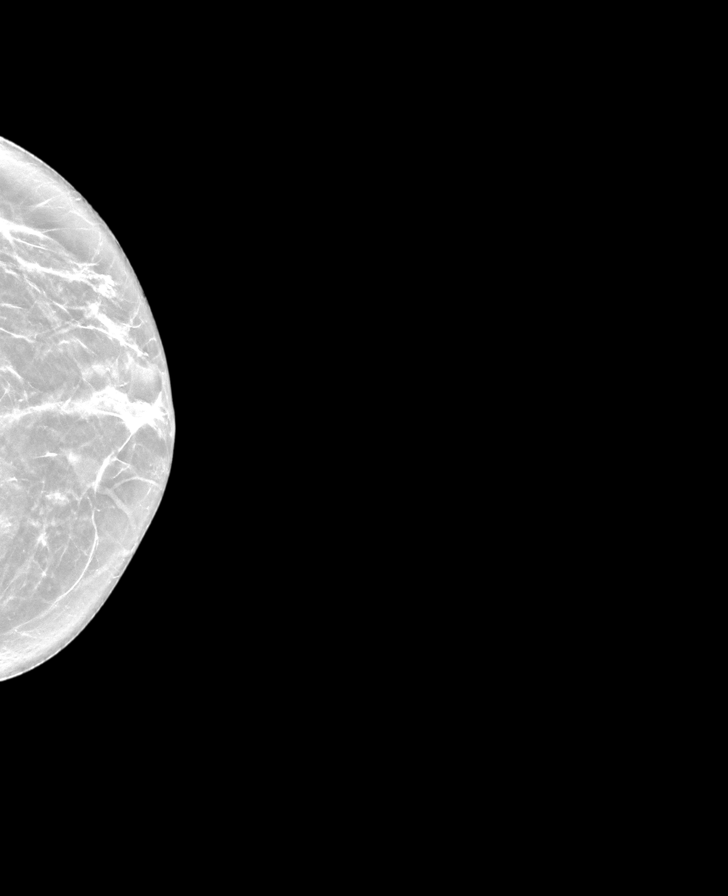

[R MLO synth-2D]
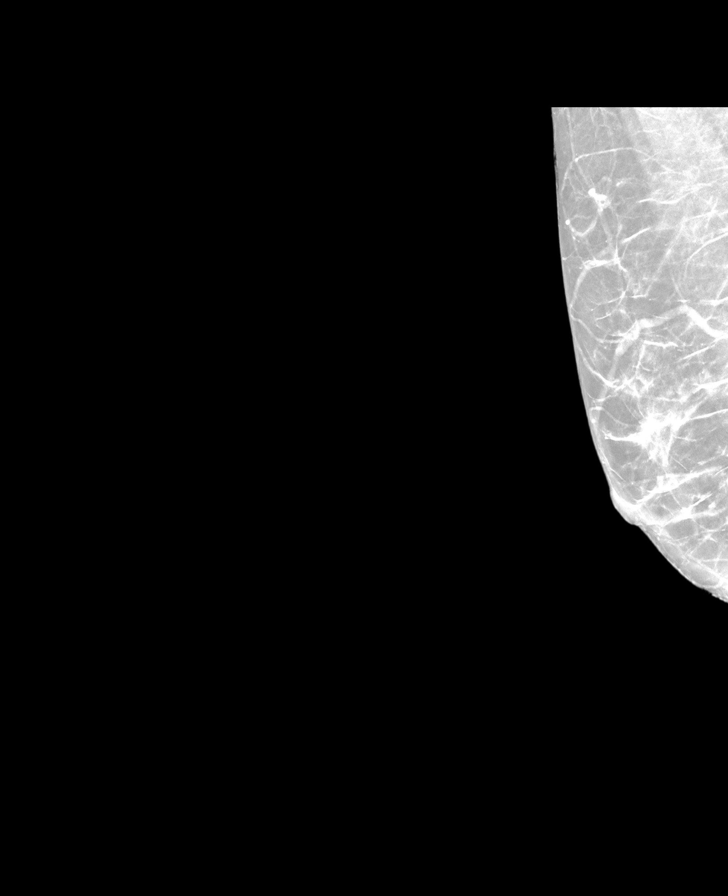

[L MLO synth-2D]
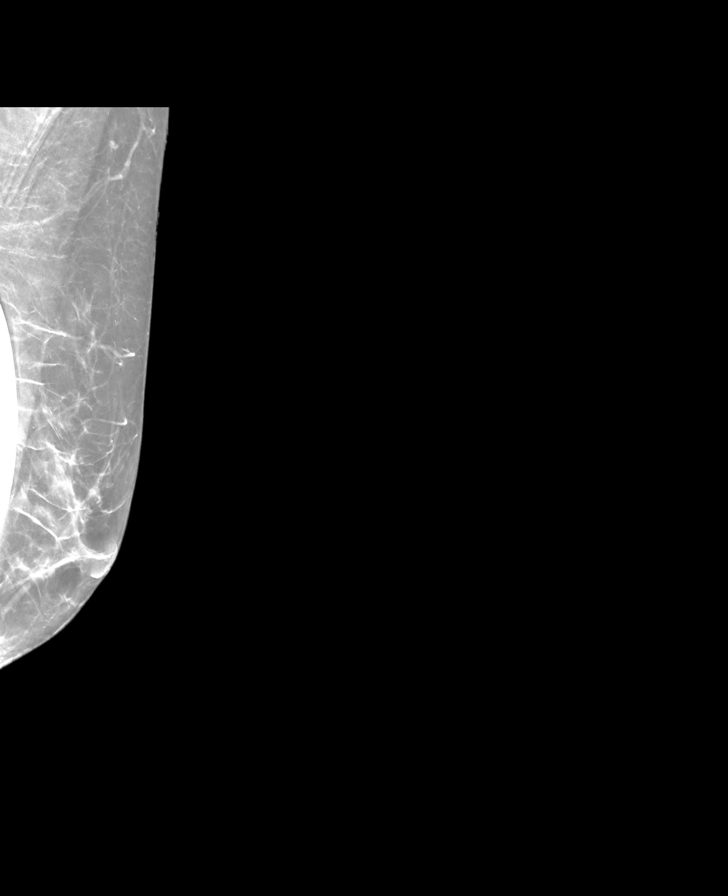

[8 of 28 positions shown; findings below may reference images not displayed]

ACR Breast Density Category b: There are scattered areas of
fibroglandular density.
FINDINGS: The patient has implants. There are no findings suspicious for
malignancy.
IMPRESSION: No mammographic evidence of malignancy. A result letter of this
screening mammogram will be mailed directly to the patient.

RECOMMENDATION:
Screening mammogram in one year. (Code:3J-K-2IG)

BI-RADS CATEGORY  1:  Negative.

## 2023-02-14 ENCOUNTER — Ambulatory Visit (INDEPENDENT_AMBULATORY_CARE_PROVIDER_SITE_OTHER): Payer: BC Managed Care – PPO

## 2023-02-14 ENCOUNTER — Ambulatory Visit (INDEPENDENT_AMBULATORY_CARE_PROVIDER_SITE_OTHER): Payer: BC Managed Care – PPO | Admitting: Podiatry

## 2023-02-14 ENCOUNTER — Other Ambulatory Visit: Payer: Self-pay | Admitting: Podiatry

## 2023-02-14 DIAGNOSIS — L600 Ingrowing nail: Secondary | ICD-10-CM

## 2023-02-14 DIAGNOSIS — M21612 Bunion of left foot: Secondary | ICD-10-CM

## 2023-02-14 DIAGNOSIS — M67472 Ganglion, left ankle and foot: Secondary | ICD-10-CM

## 2023-02-14 NOTE — Progress Notes (Signed)
Subjective:  Patient ID: Carol Vaughan, female    DOB: 05-03-1976,  MRN: [id number]  Chief Complaint  Patient presents with   Bunions    Pt states she has had the left bunions for a wile now but a couple months ago she noticed a hard piece on the bunion she states it it not hurting but wants to get it looked at    Ingrown Toenail    Pt states she has gotten an ingrown nail removed before and feels like it might be coming back and also wants it to be looked at     47 y.o. female presents with concern for bump around the left first metatarsal head.  She feels like there is a new bump that has been there also has a bunion present but does not hurt.  She says the bump does not really hurt either has just noticed it for a couple months.  She also has pain on the medial border of the left hallux nail concern for possible recurrence of ingrown nail.  Previously had the entire nail removed by Dr. Marylene Land in the past.  No past medical history on file.  No Known Allergies  ROS: Negative except as per HPI above  Objective:  General: AAO x3, NAD  Dermatological: Incurvation is present along the medial nail border of the left great toe. There is localized edema without any erythema or increase in warmth around the nail border. There is no drainage or pus. There is no ascending cellulitis. No malodor. No open lesions or pre-ulcerative lesions.  Near the first metatarsal head medial aspect there is noted to be a palpable soft tissue mass that moves with the soft tissue approximately 3 mm in diameter feels like a small joint cyst.  Versus ganglion cyst.  Does not cause pain with palpation.   Vascular:  Dorsalis Pedis artery and Posterior Tibial artery pedal pulses are 2/4 bilateral.  Capillary fill time < 3 sec to all digits.   Neruologic: Grossly intact via light touch bilateral. Protective threshold intact to all sites bilateral.   Musculoskeletal: Bunion deformity is present with hallux valgus  deformity and prominence of the medial eminence no hypermobility of the first ray is noted.  Gait: Unassisted, Nonantalgic.   No images are attached to the encounter.  Radiographs:  Date: 02/14/2023 XR the right foot Weightbearing AP/Lateral/Oblique   Findings: Hallux valgus deformity is noted on the left foot no significant osseous spurring noted about the first metatarsal head dorsally Assessment:   1. Ingrown nail of great toe of left foot   2. Bunion, left foot   3. Ganglion cyst of left foot      Plan:  Patient was evaluated and treated and all questions answered.    Ingrown Nail, left -Patient elects to proceed with minor surgery to remove ingrown toenail today. Consent reviewed and signed by patient. -Ingrown nail excised. See procedure note. -Educated on post-procedure care including soaking. Written instructions provided and reviewed. -Patient to follow up in 2 weeks for nail check.  Procedure: Excision of Ingrown Toenail Location: Left 1st toe medial nail borders. Anesthesia: Lidocaine 1% plain; 1.5 mL and Marcaine 0.5% plain; 1.5 mL, digital block. Skin Prep: Betadine. Dressing: Silvadene; telfa; dry, sterile, compression dressing. Technique: Following skin prep, the toe was exsanguinated and a tourniquet was secured at the base of the toe. The affected nail border was freed, split with a nail splitter, and excised. Chemical matrixectomy was then performed with phenol and irrigated  out with alcohol. The tourniquet was then removed and sterile dressing applied. Disposition: Patient tolerated procedure well. Patient to return in 2 weeks for follow-up.   # Ganglion cyst of left foot -Discussed with patient she appears to have a small joint cyst present at the dorsal aspect of the first metatarsal head on the left foot -Do not think this is a bone spur as it does feel slightly fluctuant and moves with the soft tissue -Fortunately does not cause pain therefore recommend  monitoring versus aspiration versus soft tissue mass excision -Patient will continue to monitor at this time  # Hallux valgus of left foot -Bunion is not currently causing pain -Recommend wider toebox shoes gel padding as needed -As she does not have hypermobility of the right would recommend distal first metatarsal osteotomy via minimally invasive approach if needed   Return in about 2 weeks (around 02/28/2023) for nail check.          Corinna Gab, DPM [facility] / [facility]

## 2023-02-14 NOTE — Patient Instructions (Signed)

## 2023-03-01 ENCOUNTER — Ambulatory Visit: Payer: BC Managed Care – PPO | Admitting: Podiatry

## 2023-03-01 DIAGNOSIS — L600 Ingrowing nail: Secondary | ICD-10-CM

## 2023-03-01 NOTE — Progress Notes (Signed)
Subjective: Carol Vaughan is a 47 y.o.  female returns to office today for follow up evaluation after having left Hallux medial border nail ingrown removal with phenol and alcohol matrixectomy approximately 2 weeks ago. Patient has been soaking using epsom salts and applying topical antibiotic covered with bandaid daily. Patient denies fevers, chills, nausea, vomiting. Denies any calf pain, chest pain, SOB.   Objective:  Vitals: Reviewed  General: Well developed, nourished, in no acute distress, alert and oriented x3   Dermatology: Skin is warm, dry and supple bilateral. left hallux nail border appears to be clean, dry, with mild granular tissue and surrounding scab. There is no surrounding erythema, edema, drainage/purulence. The remaining nails appear unremarkable at this time. There are no other lesions or other signs of infection present.  Neurovascular status: Intact. No lower extremity swelling; No pain with calf compression bilateral.  Musculoskeletal: Decreased tenderness to palpation of the left hallux nail fold(s). Muscular strength within normal limits bilateral.   Assesement and Plan: S/p phenol and alcohol matrixectomy to the  left hallux nail medial, doing well.   -Continue soaking in epsom salts twice a day followed by antibiotic ointment and a band-aid. Can leave uncovered at night. Continue this until completely healed.  -If the area has not healed in 2 weeks, call the office for follow-up appointment, or sooner if any problems arise.  -Monitor for any signs/symptoms of infection. Call the office immediately if any occur or go directly to the emergency room. Call with any questions/concerns.        Corinna Gab, DPM [facility] / [facility]                   03/01/2023

## 2023-08-19 ENCOUNTER — Other Ambulatory Visit: Payer: Self-pay | Admitting: Obstetrics

## 2023-08-19 DIAGNOSIS — Z1231 Encounter for screening mammogram for malignant neoplasm of breast: Secondary | ICD-10-CM

## 2023-09-13 ENCOUNTER — Ambulatory Visit
Admission: RE | Admit: 2023-09-13 | Discharge: 2023-09-13 | Disposition: A | Discharge status: Home / Self Care | Payer: Self-pay | Source: Ambulatory Visit | Attending: Obstetrics | Admitting: Obstetrics

## 2023-09-13 DIAGNOSIS — Z1231 Encounter for screening mammogram for malignant neoplasm of breast: Secondary | ICD-10-CM

## 2024-02-17 ENCOUNTER — Ambulatory Visit: Admitting: Podiatry

## 2024-02-17 ENCOUNTER — Ambulatory Visit (INDEPENDENT_AMBULATORY_CARE_PROVIDER_SITE_OTHER)

## 2024-02-17 DIAGNOSIS — R229 Localized swelling, mass and lump, unspecified: Secondary | ICD-10-CM

## 2024-02-17 DIAGNOSIS — M7751 Other enthesopathy of right foot: Secondary | ICD-10-CM | POA: Diagnosis not present

## 2024-02-17 NOTE — Progress Notes (Signed)
  Chief Complaint  Patient presents with   Kknot    Knot on the lateral side of the right foot. Certain shoes will cause discomfort, not real pain. Noticed about a month ago.  Not diabetic  No anti coag   HPI: 48 y.o. female presents today with concern of a bump near the dorsal lateral aspect of the right midfoot when she is using her finger to point to the area.  She does not recall an injury.  She states that it typically does not give her any discomfort unless certain straps of her sandals rub against it.  No past medical history on file. Past Surgical History:  Procedure Laterality Date   AUGMENTATION MAMMAPLASTY Bilateral 05/15/2008   NO PAST SURGERIES     No Known Allergies   Physical Exam: Palpable pedal pulses noted.  No edema is appreciated.  There is a hard, bony ridge palpated near the right fifth metatarsal base along the dorsal lateral aspect of the bone.  There is no erythema in the area.  No ecchymosis is noted.  This is not mobile.  There is no pain on palpation.  Percussion of the area does not cause any burning discomfort.  This is nonpulsatile.  Epicritic sensation is intact  Radiographic Exam (right foot, 3 weightbearing views, 02/17/2024):  Normal osseous mineralization. No fractures noted.  A lesion marker was utilized to mark the prominent area, which appears to be a bony protuberance of the base of the fifth metatarsal.  There is increase in first intermetatarsal angle.  Assessment/Plan of Care: 1. Bone spur of right foot   2. Mass of skin     Reviewed clinical and radiographic findings with the patient.  Discussed avoidance of certain shoes or straps that irritate the area as it could cause it to become inflamed.  If it becomes sore she can massage Voltaren gel over the area twice daily.  If she becomes more symptomatic we can proceed with ostectomy/craterization of the bone spur to resolve the prominence.  Follow-up prn   Awanda CHARM Imperial, DPM, FACFAS [facility]     [address] [place], [place] [address]                Office [phone]  Fax [phone]
